# Patient Record
Sex: Male | Born: 1980 | Race: White | Hispanic: No | Marital: Married | Smoking: Never smoker
Health system: Southern US, Community
[De-identification: ages and names within clinical notes are randomized; demographics above are authoritative.]

---

## 2016-07-01 ENCOUNTER — Ambulatory Visit (HOSPITAL_COMMUNITY)
Admission: EM | Admit: 2016-07-01 | Discharge: 2016-07-01 | Disposition: A | Discharge status: Home / Self Care | Payer: BLUE CROSS/BLUE SHIELD | Attending: Family Medicine | Admitting: Family Medicine

## 2016-07-01 ENCOUNTER — Ambulatory Visit (INDEPENDENT_AMBULATORY_CARE_PROVIDER_SITE_OTHER): Payer: BLUE CROSS/BLUE SHIELD

## 2016-07-01 ENCOUNTER — Encounter (HOSPITAL_COMMUNITY): Payer: Self-pay

## 2016-07-01 DIAGNOSIS — S8252XA Displaced fracture of medial malleolus of left tibia, initial encounter for closed fracture: Secondary | ICD-10-CM

## 2016-07-01 MED ORDER — HYDROCODONE-ACETAMINOPHEN 5-325 MG PO TABS: 1.0000 | ORAL_TABLET | ORAL | 0 refills | Status: AC | PRN
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
## 2016-07-01 NOTE — Discharge Instructions (Signed)
See your orthopedist this week for ankle care, ice and elevate as possible
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
